# Patient Record
Sex: Female | Born: 1990 | Hispanic: No | Marital: Single | Smoking: Never smoker
Health system: Southern US, Community
[De-identification: ages and names within clinical notes are randomized; demographics above are authoritative.]

## PROBLEM LIST (undated history)

## (undated) ENCOUNTER — Emergency Department (HOSPITAL_COMMUNITY): Payer: Self-pay | Source: Home / Self Care

---
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
## 2011-03-10 ENCOUNTER — Other Ambulatory Visit (HOSPITAL_COMMUNITY)
Admission: RE | Admit: 2011-03-10 | Discharge: 2011-03-10 | Disposition: A | Discharge status: Home / Self Care | Payer: Federal, State, Local not specified - PPO | Source: Ambulatory Visit | Attending: Obstetrics and Gynecology | Admitting: Obstetrics and Gynecology

## 2011-03-10 DIAGNOSIS — Z01419 Encounter for gynecological examination (general) (routine) without abnormal findings: Secondary | ICD-10-CM | POA: Insufficient documentation

## 2011-03-10 DIAGNOSIS — N760 Acute vaginitis: Secondary | ICD-10-CM | POA: Insufficient documentation

## 2011-03-10 DIAGNOSIS — Z113 Encounter for screening for infections with a predominantly sexual mode of transmission: Secondary | ICD-10-CM | POA: Insufficient documentation

## 2012-03-10 ENCOUNTER — Other Ambulatory Visit (HOSPITAL_COMMUNITY)
Admission: RE | Admit: 2012-03-10 | Discharge: 2012-03-10 | Disposition: A | Discharge status: Home / Self Care | Payer: Federal, State, Local not specified - PPO | Source: Ambulatory Visit | Attending: Obstetrics and Gynecology | Admitting: Obstetrics and Gynecology

## 2012-03-10 DIAGNOSIS — Z113 Encounter for screening for infections with a predominantly sexual mode of transmission: Secondary | ICD-10-CM | POA: Insufficient documentation

## 2012-03-10 DIAGNOSIS — N760 Acute vaginitis: Secondary | ICD-10-CM | POA: Insufficient documentation

## 2012-03-10 DIAGNOSIS — Z01419 Encounter for gynecological examination (general) (routine) without abnormal findings: Secondary | ICD-10-CM | POA: Insufficient documentation

## 2014-07-06 ENCOUNTER — Other Ambulatory Visit (HOSPITAL_COMMUNITY)
Admission: RE | Admit: 2014-07-06 | Discharge: 2014-07-06 | Disposition: A | Discharge status: Home / Self Care | Payer: Federal, State, Local not specified - PPO | Source: Ambulatory Visit | Attending: Obstetrics and Gynecology | Admitting: Obstetrics and Gynecology

## 2014-07-06 ENCOUNTER — Other Ambulatory Visit: Payer: Self-pay | Admitting: Obstetrics and Gynecology

## 2014-07-06 DIAGNOSIS — Z01419 Encounter for gynecological examination (general) (routine) without abnormal findings: Secondary | ICD-10-CM | POA: Insufficient documentation

## 2014-07-07 LAB — CYTOLOGY - PAP

## 2015-02-09 ENCOUNTER — Emergency Department (HOSPITAL_COMMUNITY): Payer: Self-pay

## 2015-02-09 ENCOUNTER — Encounter (HOSPITAL_COMMUNITY): Payer: Self-pay | Admitting: Emergency Medicine

## 2015-02-09 ENCOUNTER — Inpatient Hospital Stay (HOSPITAL_COMMUNITY)
Admission: EM | Admit: 2015-02-09 | Discharge: 2015-02-14 | DRG: 517 | Disposition: A | Discharge status: Home Health | Payer: Self-pay | Attending: General Surgery | Admitting: General Surgery

## 2015-02-09 ENCOUNTER — Inpatient Hospital Stay (HOSPITAL_COMMUNITY): Payer: Self-pay

## 2015-02-09 DIAGNOSIS — S80211A Abrasion, right knee, initial encounter: Secondary | ICD-10-CM | POA: Diagnosis present

## 2015-02-09 DIAGNOSIS — S40811A Abrasion of right upper arm, initial encounter: Secondary | ICD-10-CM | POA: Diagnosis present

## 2015-02-09 DIAGNOSIS — S42002A Fracture of unspecified part of left clavicle, initial encounter for closed fracture: Secondary | ICD-10-CM | POA: Diagnosis present

## 2015-02-09 DIAGNOSIS — S62626A Displaced fracture of medial phalanx of right little finger, initial encounter for closed fracture: Secondary | ICD-10-CM

## 2015-02-09 DIAGNOSIS — S80212A Abrasion, left knee, initial encounter: Secondary | ICD-10-CM | POA: Diagnosis present

## 2015-02-09 DIAGNOSIS — S50312A Abrasion of left elbow, initial encounter: Secondary | ICD-10-CM | POA: Diagnosis present

## 2015-02-09 DIAGNOSIS — S30811A Abrasion of abdominal wall, initial encounter: Secondary | ICD-10-CM | POA: Diagnosis present

## 2015-02-09 DIAGNOSIS — S62606A Fracture of unspecified phalanx of right little finger, initial encounter for closed fracture: Secondary | ICD-10-CM | POA: Diagnosis present

## 2015-02-09 DIAGNOSIS — S62616A Displaced fracture of proximal phalanx of right little finger, initial encounter for closed fracture: Principal | ICD-10-CM | POA: Diagnosis present

## 2015-02-09 DIAGNOSIS — T07XXXA Unspecified multiple injuries, initial encounter: Secondary | ICD-10-CM

## 2015-02-09 DIAGNOSIS — T148XXA Other injury of unspecified body region, initial encounter: Secondary | ICD-10-CM

## 2015-02-09 DIAGNOSIS — Z23 Encounter for immunization: Secondary | ICD-10-CM

## 2015-02-09 DIAGNOSIS — S50812A Abrasion of left forearm, initial encounter: Secondary | ICD-10-CM | POA: Diagnosis present

## 2015-02-09 LAB — COMPREHENSIVE METABOLIC PANEL
ALBUMIN: 3.9 g/dL (ref 3.5–5.0)
ALK PHOS: 60 U/L (ref 38–126)
ALT: 15 U/L (ref 14–54)
ANION GAP: 10 (ref 5–15)
AST: 29 U/L (ref 15–41)
BILIRUBIN TOTAL: 0.4 mg/dL (ref 0.3–1.2)
BUN: 10 mg/dL (ref 6–20)
CALCIUM: 8.8 mg/dL — AB (ref 8.9–10.3)
CO2: 19 mmol/L — ABNORMAL LOW (ref 22–32)
Chloride: 111 mmol/L (ref 101–111)
Creatinine, Ser: 0.8 mg/dL (ref 0.44–1.00)
GFR calc non Af Amer: >60 mL/min (ref >60)
GLUCOSE: 154 mg/dL — AB (ref 65–99)
POTASSIUM: 3.9 mmol/L (ref 3.5–5.1)
Sodium: 140 mmol/L (ref 135–145)
TOTAL PROTEIN: 7 g/dL (ref 6.5–8.1)

## 2015-02-09 LAB — CBC WITH DIFFERENTIAL/PLATELET
BASOS PCT: 0 %
Basophils Absolute: 0 10*3/uL (ref 0.0–0.1)
EOS ABS: 0 10*3/uL (ref 0.0–0.7)
Eosinophils Relative: 0 %
HEMATOCRIT: 37.1 % (ref 36.0–46.0)
Hemoglobin: 12.5 g/dL (ref 12.0–15.0)
LYMPHS ABS: 0.9 10*3/uL (ref 0.7–4.0)
Lymphocytes Relative: 5 %
MCH: 30 pg (ref 26.0–34.0)
MCHC: 33.7 g/dL (ref 30.0–36.0)
MCV: 89.2 fL (ref 78.0–100.0)
MONO ABS: 0.8 10*3/uL (ref 0.1–1.0)
MONOS PCT: 5 %
NEUTROS ABS: 15.9 10*3/uL — AB (ref 1.7–7.7)
Neutrophils Relative %: 90 %
Platelets: 321 10*3/uL (ref 150–400)
RBC: 4.16 MIL/uL (ref 3.87–5.11)
RDW: 13.4 % (ref 11.5–15.5)
WBC: 17.6 10*3/uL — ABNORMAL HIGH (ref 4.0–10.5)

## 2015-02-09 LAB — URINALYSIS, ROUTINE W REFLEX MICROSCOPIC
BILIRUBIN URINE: NEGATIVE
Glucose, UA: NEGATIVE mg/dL
KETONES UR: NEGATIVE mg/dL
LEUKOCYTES UA: NEGATIVE
NITRITE: NEGATIVE
PH: 5 (ref 5.0–8.0)
PROTEIN: NEGATIVE mg/dL
Specific Gravity, Urine: 1.02 (ref 1.005–1.030)
UROBILINOGEN UA: 0.2 mg/dL (ref 0.0–1.0)

## 2015-02-09 LAB — CBC
HCT: 38.6 % (ref 36.0–46.0)
HEMOGLOBIN: 13.1 g/dL (ref 12.0–15.0)
MCH: 30 pg (ref 26.0–34.0)
MCHC: 33.9 g/dL (ref 30.0–36.0)
MCV: 88.5 fL (ref 78.0–100.0)
Platelets: 324 10*3/uL (ref 150–400)
RBC: 4.36 MIL/uL (ref 3.87–5.11)
RDW: 13.4 % (ref 11.5–15.5)
WBC: 16 10*3/uL — ABNORMAL HIGH (ref 4.0–10.5)

## 2015-02-09 LAB — I-STAT TROPONIN, ED: TROPONIN I, POC: 0 ng/mL (ref 0.00–0.08)

## 2015-02-09 LAB — URINE MICROSCOPIC-ADD ON

## 2015-02-09 LAB — I-STAT BETA HCG BLOOD, ED (MC, WL, AP ONLY)

## 2015-02-09 MED ORDER — OXYCODONE-ACETAMINOPHEN 5-325 MG PO TABS: 1.0000 | ORAL_TABLET | ORAL | Status: AC | PRN

## 2015-02-09 MED ORDER — PANTOPRAZOLE SODIUM 40 MG IV SOLR: 40.0000 mg | Freq: Every day | INTRAVENOUS | Status: DC

## 2015-02-09 MED ORDER — MORPHINE SULFATE (PF) 2 MG/ML IV SOLN
2.0000 mg | INTRAVENOUS | Status: DC | PRN
  Administered 2015-02-09 – 2015-02-11 (×3): 2 mg via INTRAVENOUS
  Filled 2015-02-09 (×5): qty 1

## 2015-02-09 MED ORDER — TETANUS-DIPHTH-ACELL PERTUSSIS 5-2.5-18.5 LF-MCG/0.5 IM SUSP: 0.5000 mL | Freq: Once | INTRAMUSCULAR | Status: DC

## 2015-02-09 MED ORDER — SENNOSIDES-DOCUSATE SODIUM 8.6-50 MG PO TABS: 1.0000 | ORAL_TABLET | Freq: Every day | ORAL | Status: AC

## 2015-02-09 MED ORDER — ONDANSETRON HCL 4 MG/2ML IJ SOLN
4.0000 mg | Freq: Four times a day (QID) | INTRAMUSCULAR | Status: DC | PRN
  Administered 2015-02-09 – 2015-02-13 (×2): 4 mg via INTRAVENOUS
  Filled 2015-02-09 (×2): qty 2

## 2015-02-09 MED ORDER — MORPHINE SULFATE (PF) 4 MG/ML IV SOLN: 4.0000 mg | Freq: Once | INTRAVENOUS | Status: AC

## 2015-02-09 MED ORDER — SILVER SULFADIAZINE 1 % EX CREA
TOPICAL_CREAM | Freq: Every day | CUTANEOUS | Status: DC
  Administered 2015-02-10 – 2015-02-11 (×2): via TOPICAL
  Administered 2015-02-12: 1 via TOPICAL
  Administered 2015-02-14: 14:00:00 via TOPICAL
  Filled 2015-02-09 (×4): qty 85

## 2015-02-09 MED ORDER — SILVER SULFADIAZINE 1 % EX CREA: TOPICAL_CREAM | Freq: Once | CUTANEOUS | Status: DC

## 2015-02-09 MED ORDER — TETANUS-DIPHTH-ACELL PERTUSSIS 5-2.5-18.5 LF-MCG/0.5 IM SUSP
0.5000 mL | Freq: Once | INTRAMUSCULAR | Status: AC
  Administered 2015-02-09: 0.5 mL via INTRAMUSCULAR
  Filled 2015-02-09: qty 0.5

## 2015-02-09 MED ORDER — OXYCODONE HCL 5 MG PO TABS
10.0000 mg | ORAL_TABLET | ORAL | Status: DC | PRN
  Administered 2015-02-09 – 2015-02-14 (×6): 10 mg via ORAL
  Filled 2015-02-09 (×8): qty 2

## 2015-02-09 MED ORDER — PANTOPRAZOLE SODIUM 40 MG PO TBEC
40.0000 mg | DELAYED_RELEASE_TABLET | Freq: Every day | ORAL | Status: DC
  Administered 2015-02-10 – 2015-02-11 (×2): 40 mg via ORAL
  Filled 2015-02-09 (×2): qty 1

## 2015-02-09 MED ORDER — MORPHINE SULFATE (PF) 4 MG/ML IV SOLN
4.0000 mg | Freq: Once | INTRAVENOUS | Status: DC
  Administered 2015-02-09: 4 mg via INTRAVENOUS
  Filled 2015-02-09: qty 1

## 2015-02-09 MED ORDER — MORPHINE SULFATE (PF) 2 MG/ML IV SOLN
2.0000 mg | Freq: Once | INTRAVENOUS | Status: AC
  Administered 2015-02-09: 2 mg via INTRAVENOUS
  Filled 2015-02-09: qty 1

## 2015-02-09 MED ORDER — MORPHINE SULFATE (PF) 4 MG/ML IV SOLN
4.0000 mg | Freq: Once | INTRAVENOUS | Status: AC
  Administered 2015-02-09: 4 mg via INTRAVENOUS
  Filled 2015-02-09: qty 1

## 2015-02-09 MED ORDER — CEPHALEXIN 250 MG PO CAPS: 250.0000 mg | ORAL_CAPSULE | Freq: Four times a day (QID) | ORAL | Status: DC

## 2015-02-09 MED ORDER — LIDOCAINE HCL (PF) 1 % IJ SOLN
30.0000 mL | Freq: Once | INTRAMUSCULAR | Status: AC
  Administered 2015-02-09: 30 mL via INTRADERMAL
  Filled 2015-02-09: qty 30

## 2015-02-09 MED ORDER — KCL IN DEXTROSE-NACL 20-5-0.45 MEQ/L-%-% IV SOLN
INTRAVENOUS | Status: DC
  Administered 2015-02-09: 16:00:00 via INTRAVENOUS
  Filled 2015-02-09: qty 1000

## 2015-02-09 MED ORDER — ACETAMINOPHEN 325 MG PO TABS
650.0000 mg | ORAL_TABLET | ORAL | Status: DC | PRN
  Administered 2015-02-09 – 2015-02-11 (×4): 650 mg via ORAL
  Filled 2015-02-09 (×4): qty 2

## 2015-02-09 MED ORDER — SODIUM CHLORIDE 0.9 % IV BOLUS (SEPSIS)
1000.0000 mL | Freq: Once | INTRAVENOUS | Status: AC
  Administered 2015-02-09: 1000 mL via INTRAVENOUS

## 2015-02-09 MED ORDER — ONDANSETRON HCL 4 MG PO TABS
4.0000 mg | ORAL_TABLET | Freq: Four times a day (QID) | ORAL | Status: DC | PRN
  Administered 2015-02-11 – 2015-02-12 (×2): 4 mg via ORAL
  Filled 2015-02-09 (×2): qty 1

## 2015-02-09 MED ORDER — IOHEXOL 300 MG/ML  SOLN
100.0000 mL | Freq: Once | INTRAMUSCULAR | Status: AC | PRN
  Administered 2015-02-09: 100 mL via INTRAVENOUS

## 2015-02-09 MED ORDER — OXYCODONE HCL 5 MG PO TABS
5.0000 mg | ORAL_TABLET | ORAL | Status: DC | PRN
  Administered 2015-02-11: 5 mg via ORAL
  Filled 2015-02-09 (×2): qty 1

## 2015-02-09 MED ORDER — BACITRACIN ZINC 500 UNIT/GM EX OINT
TOPICAL_OINTMENT | Freq: Two times a day (BID) | CUTANEOUS | Status: DC
  Administered 2015-02-09: 1 via TOPICAL
  Filled 2015-02-09 (×2): qty 1.8

## 2015-02-09 MED ORDER — BACITRACIN ZINC 500 UNIT/GM EX OINT: 1.0000 "application " | TOPICAL_OINTMENT | Freq: Two times a day (BID) | CUTANEOUS | Status: AC

## 2015-02-09 MED ORDER — OXYCODONE-ACETAMINOPHEN 5-325 MG PO TABS
2.0000 | ORAL_TABLET | Freq: Once | ORAL | Status: AC
  Administered 2015-02-09: 2 via ORAL
  Filled 2015-02-09: qty 2
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
## 2015-02-09 NOTE — ED Notes (Signed)
Pt returned from CT °

## 2015-02-09 NOTE — ED Notes (Signed)
Pt transported to xray 
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
## 2015-02-09 NOTE — Progress Notes (Signed)
Upright xrays show acceptable alignment of left clavicle.  Will continue with nonop treatment.  Mayra Reel, MD [facility] [phone] 8:13 PM
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
## 2015-02-09 NOTE — ED Notes (Signed)
Called Dr. Ranae Palms as patient requests left hand be xray'd as well, since it is sore. Abrasion present with mild swelling. md acknowledges.
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
## 2015-02-09 NOTE — ED Notes (Signed)
Abrasions noted in physical diagram.  Patient would not allow this nurse to physically assess her skin as I am a female and her religious beliefs prohibit this.

## 2015-02-09 NOTE — ED Notes (Signed)
Patient is out of the department for testing; visitor sitting in room waiting for patient to return

## 2015-02-09 NOTE — ED Notes (Signed)
Dr. Rosalia Hammers informed of elevated HR prior to patient being dc.  To see pt.  Additional scans and consult to trauma ordered.

## 2015-02-09 NOTE — ED Notes (Signed)
Patient returned from xray. MD and RN at bedside.

## 2015-02-09 NOTE — Consult Note (Signed)
  See [id number] Amanda Pea MD

## 2015-02-10 MED ORDER — SILVER SULFADIAZINE 1 % EX CREA
TOPICAL_CREAM | CUTANEOUS | Status: DC
  Administered 2015-02-11: 15:00:00 via TOPICAL
  Administered 2015-02-12: 1 via TOPICAL
  Filled 2015-02-10: qty 85

## 2015-02-10 MED ORDER — SILVER SULFADIAZINE 1 % EX CREA: TOPICAL_CREAM | Freq: Every day | CUTANEOUS | Status: DC
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
## 2015-02-10 NOTE — Progress Notes (Signed)
Patient refused labs this morning.  She said that both her arms are sore.  She is requesting an order for feet sticks for lab.

## 2015-02-11 MED ORDER — POLYETHYLENE GLYCOL 3350 17 G PO PACK
17.0000 g | PACK | Freq: Every day | ORAL | Status: DC
  Administered 2015-02-11 – 2015-02-14 (×4): 17 g via ORAL
  Filled 2015-02-11 (×4): qty 1

## 2015-02-11 MED ORDER — ENOXAPARIN SODIUM 40 MG/0.4ML ~~LOC~~ SOLN
40.0000 mg | SUBCUTANEOUS | Status: DC
  Administered 2015-02-11 – 2015-02-14 (×4): 40 mg via SUBCUTANEOUS
  Filled 2015-02-11 (×4): qty 0.4
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
## 2015-02-11 NOTE — Progress Notes (Signed)
PT Cancellation Note  Patient Details Name: Donnalee Cellucci MRN: [id number] DOB: [birth year]   Cancelled Treatment:    Reason Eval/Treat Not Completed: Patient at procedure or test/unavailable (Attempted x2 to see pt; RN changing dressing both times).  Will attempt to see pt again as time allows.  PT will continue to follow acutely.   Michail Jewels PT, DPT [phone] Pager: [phone]  02/11/2015, 3:19 PM

## 2015-02-11 NOTE — Progress Notes (Signed)
OT Cancellation Note  Patient Details Mackenzie [name] MRN: [id number] DOB: [birth year]   Cancelled Treatment:    Reason Eval/Treat Not Completed: Medical issues which prohibited therapy. Nurse administering lovenox. Will reattempt at a later time per protocol for ambulating post Lovenox.   Pilar Grammes 02/11/2015, 11:37 AM

## 2015-02-12 ENCOUNTER — Encounter: Payer: Self-pay | Admitting: Orthopedic Surgery

## 2015-02-12 DIAGNOSIS — T07XXXA Unspecified multiple injuries, initial encounter: Secondary | ICD-10-CM

## 2015-02-12 DIAGNOSIS — S62606A Fracture of unspecified phalanx of right little finger, initial encounter for closed fracture: Secondary | ICD-10-CM | POA: Diagnosis present

## 2015-02-12 MED ORDER — DOCUSATE SODIUM 100 MG PO CAPS
100.0000 mg | ORAL_CAPSULE | Freq: Two times a day (BID) | ORAL | Status: DC
  Administered 2015-02-12 – 2015-02-14 (×4): 100 mg via ORAL
  Filled 2015-02-12 (×4): qty 1
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
## 2015-02-12 NOTE — Progress Notes (Signed)
Patient ID: Mackenzie [name], female   DOB: 1990-08-01, 24 y.o.   MRN: [id number] Patient seen and examined  Finger is still tender. It is in her bandage which I previously placed.  I discussed with patient our plans for surgery. We'll discuss with trauma. We'll plan for ORIF versus closed reduction and pinning right small finger tomorrow.  All questions have been encouraged and answered  We are planning surgery for your upper extremity. The risk and benefits of surgery to include risk of bleeding, infection, anesthesia,  damage to normal structures and failure of the surgery to accomplish its intended goals of relieving symptoms and restoring function have been discussed in detail. With this in mind we plan to proceed. I have specifically discussed with the patient the pre-and postoperative regime and the dos and don'ts and risk and benefits in great detail. Risk and benefits of surgery also include risk of dystrophy(CRPS), chronic nerve pain, failure of the healing process to go onto completion and other inherent risks of surgery The relavent the pathophysiology of the disease/injury process, as well as the alternatives for treatment and postoperative course of action has been discussed in great detail with the patient who desires to proceed.  We will do everything in our power to help you (the patient) restore function to the upper extremity. It is a pleasure to see this patient today.  Gramig MD

## 2015-02-12 NOTE — Progress Notes (Signed)
Patient ID: Mackenzie [name], female   DOB: 03-Sep-1990, 24 y.o.   MRN: [id number] Events noted Right small finger will need elective ORIF Will plan this week Patient aware [name] MD
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
## 2015-02-13 ENCOUNTER — Inpatient Hospital Stay (HOSPITAL_COMMUNITY): Payer: Federal, State, Local not specified - PPO | Admitting: Anesthesiology

## 2015-02-13 ENCOUNTER — Encounter (HOSPITAL_COMMUNITY): Payer: Self-pay

## 2015-02-13 ENCOUNTER — Encounter (HOSPITAL_COMMUNITY): Admission: EM | Disposition: A | Discharge status: Home Health | Payer: Self-pay | Source: Home / Self Care

## 2015-02-13 ENCOUNTER — Inpatient Hospital Stay (HOSPITAL_COMMUNITY): Payer: Self-pay | Admitting: Anesthesiology

## 2015-02-13 HISTORY — PX: OPEN REDUCTION INTERNAL FIXATION (ORIF) METACARPAL: SHX6234

## 2015-02-13 LAB — SURGICAL PCR SCREEN
MRSA, PCR: NEGATIVE
Staphylococcus aureus: NEGATIVE

## 2015-02-13 SURGERY — OPEN REDUCTION INTERNAL FIXATION (ORIF) METACARPAL
Anesthesia: General | Site: Hand | Laterality: Right

## 2015-02-13 MED ORDER — ONDANSETRON HCL 4 MG/2ML IJ SOLN
INTRAMUSCULAR | Status: DC | PRN
  Administered 2015-02-13: 4 mg via INTRAVENOUS

## 2015-02-13 MED ORDER — BUPIVACAINE HCL (PF) 0.25 % IJ SOLN
INTRAMUSCULAR | Status: DC | PRN
  Administered 2015-02-13: 9 mL

## 2015-02-13 MED ORDER — MIDAZOLAM HCL 2 MG/2ML IJ SOLN
INTRAMUSCULAR | Status: AC
  Filled 2015-02-13: qty 4

## 2015-02-13 MED ORDER — LACTATED RINGERS IV SOLN
INTRAVENOUS | Status: DC | PRN
  Administered 2015-02-13: 18:00:00 via INTRAVENOUS

## 2015-02-13 MED ORDER — MEPERIDINE HCL 25 MG/ML IJ SOLN: 6.2500 mg | INTRAMUSCULAR | Status: DC | PRN

## 2015-02-13 MED ORDER — FENTANYL CITRATE (PF) 100 MCG/2ML IJ SOLN
INTRAMUSCULAR | Status: DC | PRN
  Administered 2015-02-13 (×2): 50 ug via INTRAVENOUS
  Administered 2015-02-13: 100 ug via INTRAVENOUS
  Administered 2015-02-13: 50 ug via INTRAVENOUS

## 2015-02-13 MED ORDER — PROPOFOL 10 MG/ML IV BOLUS
INTRAVENOUS | Status: DC | PRN
  Administered 2015-02-13: 150 mg via INTRAVENOUS

## 2015-02-13 MED ORDER — PHENYLEPHRINE HCL 10 MG/ML IJ SOLN
INTRAMUSCULAR | Status: DC | PRN
  Administered 2015-02-13 (×3): 80 ug via INTRAVENOUS

## 2015-02-13 MED ORDER — LIDOCAINE HCL (CARDIAC) 20 MG/ML IV SOLN
INTRAVENOUS | Status: AC
  Filled 2015-02-13: qty 5

## 2015-02-13 MED ORDER — BUPIVACAINE HCL (PF) 0.5 % IJ SOLN
INTRAMUSCULAR | Status: AC
  Filled 2015-02-13: qty 10

## 2015-02-13 MED ORDER — HYDROMORPHONE HCL 1 MG/ML IJ SOLN
INTRAMUSCULAR | Status: AC
  Filled 2015-02-13: qty 1

## 2015-02-13 MED ORDER — MIDAZOLAM HCL 5 MG/5ML IJ SOLN
INTRAMUSCULAR | Status: DC | PRN
  Administered 2015-02-13 (×2): 2 mg via INTRAVENOUS

## 2015-02-13 MED ORDER — BUPIVACAINE-EPINEPHRINE (PF) 0.25% -1:200000 IJ SOLN
INTRAMUSCULAR | Status: AC
  Filled 2015-02-13: qty 30

## 2015-02-13 MED ORDER — PROMETHAZINE HCL 25 MG/ML IJ SOLN: 6.2500 mg | INTRAMUSCULAR | Status: DC | PRN

## 2015-02-13 MED ORDER — LACTATED RINGERS IV SOLN
INTRAVENOUS | Status: DC
  Administered 2015-02-13: 16:00:00 via INTRAVENOUS

## 2015-02-13 MED ORDER — ONDANSETRON HCL 4 MG/2ML IJ SOLN
4.0000 mg | Freq: Once | INTRAMUSCULAR | Status: DC | PRN
  Filled 2015-02-13: qty 2

## 2015-02-13 MED ORDER — LIDOCAINE HCL (CARDIAC) 20 MG/ML IV SOLN
INTRAVENOUS | Status: DC | PRN
  Administered 2015-02-13: 50 mg via INTRAVENOUS

## 2015-02-13 MED ORDER — FENTANYL CITRATE (PF) 250 MCG/5ML IJ SOLN
INTRAMUSCULAR | Status: AC
  Filled 2015-02-13: qty 5

## 2015-02-13 MED ORDER — FENTANYL CITRATE (PF) 100 MCG/2ML IJ SOLN
INTRAMUSCULAR | Status: AC
  Filled 2015-02-13: qty 2

## 2015-02-13 MED ORDER — FENTANYL CITRATE (PF) 100 MCG/2ML IJ SOLN
25.0000 ug | INTRAMUSCULAR | Status: DC | PRN
  Administered 2015-02-13 (×2): 25 ug via INTRAVENOUS

## 2015-02-13 MED ORDER — OXYCODONE HCL 5 MG/5ML PO SOLN: 5.0000 mg | Freq: Once | ORAL | Status: DC | PRN

## 2015-02-13 MED ORDER — 0.9 % SODIUM CHLORIDE (POUR BTL) OPTIME
TOPICAL | Status: DC | PRN
  Administered 2015-02-13: 1000 mL

## 2015-02-13 MED ORDER — CEFAZOLIN SODIUM-DEXTROSE 2-3 GM-% IV SOLR
INTRAVENOUS | Status: DC | PRN
  Administered 2015-02-13: 2 g via INTRAVENOUS

## 2015-02-13 MED ORDER — BUPIVACAINE HCL (PF) 0.25 % IJ SOLN
INTRAMUSCULAR | Status: AC
  Filled 2015-02-13: qty 30

## 2015-02-13 MED ORDER — PROPOFOL 10 MG/ML IV BOLUS
INTRAVENOUS | Status: AC
  Filled 2015-02-13: qty 20

## 2015-02-13 MED ORDER — OXYCODONE HCL 5 MG PO TABS: 5.0000 mg | ORAL_TABLET | Freq: Once | ORAL | Status: DC | PRN

## 2015-02-13 SURGICAL SUPPLY — 60 items
BANDAGE ELASTIC 3 VELCRO ST LF (GAUZE/BANDAGES/DRESSINGS) IMPLANT
BANDAGE ELASTIC 4 VELCRO ST LF (GAUZE/BANDAGES/DRESSINGS) IMPLANT
BLADE SURG ROTATE 9660 (MISCELLANEOUS) IMPLANT
BNDG ESMARK 4X9 LF (GAUZE/BANDAGES/DRESSINGS) IMPLANT
BNDG GAUZE ELAST 4 BULKY (GAUZE/BANDAGES/DRESSINGS) IMPLANT
CORDS BIPOLAR (ELECTRODE) ×3 IMPLANT
COVER SURGICAL LIGHT HANDLE (MISCELLANEOUS) ×3 IMPLANT
CUFF TOURNIQUET SINGLE 18IN (TOURNIQUET CUFF) ×3 IMPLANT
CUFF TOURNIQUET SINGLE 24IN (TOURNIQUET CUFF) IMPLANT
DECANTER SPIKE VIAL GLASS SM (MISCELLANEOUS) ×3 IMPLANT
DRAIN TLS ROUND 10FR (DRAIN) IMPLANT
DRAPE OEC MINIVIEW 54X84 (DRAPES) ×3 IMPLANT
DRAPE SURG 17X23 STRL (DRAPES) ×3 IMPLANT
DRSG ADAPTIC 3X8 NADH LF (GAUZE/BANDAGES/DRESSINGS) ×3 IMPLANT
GAUZE SPONGE 4X4 12PLY STRL (GAUZE/BANDAGES/DRESSINGS) ×3 IMPLANT
GAUZE XEROFORM 1X8 LF (GAUZE/BANDAGES/DRESSINGS) IMPLANT
GAUZE XEROFORM 5X9 LF (GAUZE/BANDAGES/DRESSINGS) ×3 IMPLANT
GLOVE BIO SURGEON STRL SZ7 (GLOVE) ×3 IMPLANT
GLOVE BIOGEL M STRL SZ7.5 (GLOVE) IMPLANT
GLOVE BIOGEL PI IND STRL 6.5 (GLOVE) ×1 IMPLANT
GLOVE BIOGEL PI IND STRL 7.5 (GLOVE) ×1 IMPLANT
GLOVE BIOGEL PI INDICATOR 6.5 (GLOVE) ×2
GLOVE BIOGEL PI INDICATOR 7.5 (GLOVE) ×2
GLOVE SS BIOGEL STRL SZ 8 (GLOVE) ×1 IMPLANT
GLOVE SUPERSENSE BIOGEL SZ 8 (GLOVE) ×2
GLOVE SURG SS PI 7.0 STRL IVOR (GLOVE) ×3 IMPLANT
GOWN STRL REUS W/ TWL LRG LVL3 (GOWN DISPOSABLE) ×2 IMPLANT
GOWN STRL REUS W/ TWL XL LVL3 (GOWN DISPOSABLE) ×1 IMPLANT
GOWN STRL REUS W/TWL LRG LVL3 (GOWN DISPOSABLE) ×4
GOWN STRL REUS W/TWL XL LVL3 (GOWN DISPOSABLE) ×2
K-WIRE DBL 4X.28 (WIRE) ×6
K-WIRE DBL TROCAR .045X4 ×6 IMPLANT
KIT BASIN OR (CUSTOM PROCEDURE TRAY) ×3 IMPLANT
KIT ROOM TURNOVER OR (KITS) ×3 IMPLANT
KWIRE DBL 4X.28 (WIRE) ×2 IMPLANT
KWIRE DBL TROCAR .045X4 ×2 IMPLANT
LOOP VESSEL MAXI BLUE (MISCELLANEOUS) IMPLANT
MANIFOLD NEPTUNE II (INSTRUMENTS) IMPLANT
NEEDLE 22X1 1/2 (OR ONLY) (NEEDLE) ×3 IMPLANT
NS IRRIG 1000ML POUR BTL (IV SOLUTION) IMPLANT
PACK ORTHO EXTREMITY (CUSTOM PROCEDURE TRAY) ×3 IMPLANT
PAD ARMBOARD 7.5X6 YLW CONV (MISCELLANEOUS) ×3 IMPLANT
PAD CAST 3X4 CTTN HI CHSV (CAST SUPPLIES) IMPLANT
PAD CAST 4YDX4 CTTN HI CHSV (CAST SUPPLIES) IMPLANT
PADDING CAST COTTON 3X4 STRL (CAST SUPPLIES)
PADDING CAST COTTON 4X4 STRL (CAST SUPPLIES)
SPLINT FIBERGLASS 3X12 (CAST SUPPLIES) ×3 IMPLANT
SPONGE GAUZE 4X4 12PLY STER LF (GAUZE/BANDAGES/DRESSINGS) ×3 IMPLANT
SPONGE LAP 4X18 X RAY DECT (DISPOSABLE) IMPLANT
SUT MNCRL AB 4-0 PS2 18 (SUTURE) IMPLANT
SUT PROLENE 3 0 PS 2 (SUTURE) IMPLANT
SUT VIC AB 3-0 FS2 27 (SUTURE) IMPLANT
SYR CONTROL 10ML LL (SYRINGE) ×3 IMPLANT
SYSTEM CHEST DRAIN TLS 7FR (DRAIN) IMPLANT
TOWEL OR 17X24 6PK STRL BLUE (TOWEL DISPOSABLE) ×3 IMPLANT
TOWEL OR 17X26 10 PK STRL BLUE (TOWEL DISPOSABLE) ×3 IMPLANT
TUBE CONNECTING 12'X1/4 (SUCTIONS)
TUBE CONNECTING 12X1/4 (SUCTIONS) IMPLANT
TUBE EVACUATION TLS (MISCELLANEOUS) IMPLANT
WATER STERILE IRR 1000ML POUR (IV SOLUTION) IMPLANT

## 2015-02-13 NOTE — Progress Notes (Signed)
Patient is status post external dynamic fixator. She tolerated the procedure well. Tentatively the patient is planning for discharge tomorrow per trauma services. Patient is stable per hand surgery and will need to follow up with [place] at our office in one week. She should keep her splint on at all times, keep the dressings clean, dry, intact and do not remove this plan. We recommend she elevate the hand frequently above her heart. No weightbearing with the right hand. She can weight-bear through her proximal forearm and elbow. Range of motion about the elbow and shoulder is without restriction. We recommend Keflex 500 mg 1 by mouth 4 times a day 10 days at time of discharge. Please have the patient contact our office for any questions or concerns in regards to her upper extremity procedure at [phone].  Arlys John L. Areatha Keas [facility] Hand [facility]
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
## 2015-02-13 NOTE — Op Note (Signed)
See dictation#[id number] SP EX Fix right SF Fx Gramig MD

## 2015-02-13 NOTE — Anesthesia Preprocedure Evaluation (Addendum)
Anesthesia Evaluation  Patient identified by MRN, date of birth, ID band Patient awake    Reviewed: Allergy & Precautions, NPO status , Patient's Chart, lab work & pertinent test results  Airway Mallampati: II  TM Distance: >3 FB Neck ROM: Full    Dental no notable dental hx. (+) Teeth Intact, Dental Advisory Given   Pulmonary neg pulmonary ROS,    Pulmonary exam normal breath sounds clear to auscultation       Cardiovascular negative cardio ROS Normal cardiovascular exam Rhythm:Regular Rate:Normal     Neuro/Psych negative neurological ROS  negative psych ROS   GI/Hepatic negative GI ROS, Neg liver ROS,   Endo/Other  negative endocrine ROS  Renal/GU negative Renal ROS     Musculoskeletal negative musculoskeletal ROS (+)   Abdominal   Peds  Hematology negative hematology ROS (+)   Anesthesia Other Findings   Reproductive/Obstetrics negative OB ROS                            Anesthesia Physical Anesthesia Plan  ASA: II  Anesthesia Plan: General   Post-op Pain Management:    Induction: Intravenous  Airway Management Planned: LMA and Oral ETT  Additional Equipment:   Intra-op Plan:   Post-operative Plan: Extubation in OR  Informed Consent: I have reviewed the patients History and Physical, chart, labs and discussed the procedure including the risks, benefits and alternatives for the proposed anesthesia with the patient or authorized representative who has indicated his/her understanding and acceptance.   Dental advisory given  Plan Discussed with: CRNA  Anesthesia Plan Comments: (Plan GA with LMA  Kipp Brood)       Anesthesia Quick Evaluation
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
## 2015-02-13 NOTE — Anesthesia Procedure Notes (Signed)
Procedure Name: LMA Insertion Date/Time: 02/13/2015 6:26 PM Performed by: Gavin Pound, HAL J Pre-anesthesia Checklist: Patient identified, Emergency Drugs available, Suction available, Patient being monitored and Timeout performed Patient Re-evaluated:Patient Re-evaluated prior to inductionOxygen Delivery Method: Circle system utilized Preoxygenation: Pre-oxygenation with 100% oxygen Intubation Type: IV induction Ventilation: Mask ventilation without difficulty LMA: LMA inserted LMA Size: 4.0 Number of attempts: 1 Placement Confirmation: positive ETCO2 and breath sounds checked- equal and bilateral Tube secured with: Tape Dental Injury: Teeth and Oropharynx as per pre-operative assessment

## 2015-02-13 NOTE — Transfer of Care (Signed)
Immediate Anesthesia Transfer of Care Note  Patient: Mackenzie [name]  Procedure(s) Performed: Procedure(s): CLOSED REDUCTION and pinning fixation RIGHT SMALL FINGER (Right)  Patient Location: PACU  Anesthesia Type:General  Level of Consciousness: awake  Airway & Oxygen Therapy: Patient Spontanous Breathing  Post-op Assessment: Report given to RN and Post -op Vital signs reviewed and stable  Post vital signs: Reviewed and stable  Last Vitals:  Filed Vitals:   02/13/15 2022  BP: 109/38  Pulse: 91  Temp: 36.9 C  Resp: 13    Complications: No apparent anesthesia complications

## 2015-02-13 NOTE — Progress Notes (Signed)
Spoke with MD regarding pts WB status and activity orders of L UE. Pt is NBW in her LUE; no ROM of L shoulder; ROM of L fingers, wrist, hand OK; sling on at all times except for bathing, dressing and exercises.   Hurshel Party, M.S., OTR/L Pager: [phone]  02/13/15, 3:49 PM

## 2015-02-13 NOTE — H&P (Signed)
  Plan ORIF right small finger We are planning surgery for your upper extremity. The risk and benefits of surgery to include risk of bleeding, infection, anesthesia,  damage to normal structures and failure of the surgery to accomplish its intended goals of relieving symptoms and restoring function have been discussed in detail. With this in mind we plan to proceed. I have specifically discussed with the patient the pre-and postoperative regime and the dos and don'ts and risk and benefits in great detail. Risk and benefits of surgery also include risk of dystrophy(CRPS), chronic nerve pain, failure of the healing process to go onto completion and other inherent risks of surgery The relavent the pathophysiology of the disease/injury process, as well as the alternatives for treatment and postoperative course of action has been discussed in great detail with the patient who desires to proceed.  We will do everything in our power to help you (the patient) restore function to the upper extremity. It is a pleasure to see this patient today. Chrisopher Pustejovsky MD
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
## 2015-02-14 ENCOUNTER — Encounter (HOSPITAL_COMMUNITY): Payer: Self-pay | Admitting: Orthopedic Surgery

## 2015-02-14 MED ORDER — OXYCODONE HCL 10 MG PO TABS: 5.0000 mg | ORAL_TABLET | ORAL | Status: AC | PRN

## 2015-02-14 MED ORDER — CEPHALEXIN 500 MG PO CAPS: 500.0000 mg | ORAL_CAPSULE | Freq: Four times a day (QID) | ORAL | Status: AC

## 2015-02-14 MED ORDER — ACETAMINOPHEN 325 MG PO TABS: 650.0000 mg | ORAL_TABLET | ORAL | Status: AC | PRN

## 2015-02-14 MED ORDER — ONDANSETRON HCL 4 MG PO TABS: 4.0000 mg | ORAL_TABLET | Freq: Four times a day (QID) | ORAL | Status: AC | PRN

## 2015-02-14 MED ORDER — CEPHALEXIN 250 MG PO CAPS: 250.0000 mg | ORAL_CAPSULE | Freq: Four times a day (QID) | ORAL | Status: DC

## 2015-02-14 MED ORDER — SILVER SULFADIAZINE 1 % EX CREA: TOPICAL_CREAM | Freq: Every day | CUTANEOUS | Status: AC
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
## 2015-02-14 NOTE — Progress Notes (Signed)
Patient ID: Mackenzie [name], female   DOB: 04-25-1991, 24 y.o.   MRN: [id number] Patient seen at bedside Operation discussed with her at length  I would like see her back in the office in 1 week We would recommend Keflex 500 mg 4 times a day 10 days. All questions have been encouraged and answered. Gramig M.D.
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
## 2015-02-14 NOTE — Discharge Instructions (Signed)
Abrasion An abrasion is a cut or scrape of the skin. Abrasions do not extend through all layers of the skin and most heal within 10 days. It is important to care for your abrasion properly to prevent infection. CAUSES  Most abrasions are caused by falling on, or gliding across, the ground or other surface. When your skin rubs on something, the outer and inner layer of skin rubs off, causing an abrasion. DIAGNOSIS  Your caregiver will be able to diagnose an abrasion during a physical exam.  TREATMENT  Your treatment depends on how large and deep the abrasion is. Generally, your abrasion will be cleaned with water and a mild soap to remove any dirt or debris. An antibiotic ointment may be put over the abrasion to prevent an infection. A bandage (dressing) may be wrapped around the abrasion to keep it from getting dirty.  You may need a tetanus shot if:  You cannot remember when you had your last tetanus shot.  You have never had a tetanus shot.  The injury broke your skin. If you get a tetanus shot, your arm may swell, get red, and feel warm to the touch. This is common and not a problem. If you need a tetanus shot and you choose not to have one, there is a rare chance of getting tetanus. Sickness from tetanus can be serious.  HOME CARE INSTRUCTIONS   If a dressing was applied, change it at least once a day or as directed by your caregiver. If the bandage sticks, soak it off with warm water.   Wash the area with water and a mild soap to remove all the ointment 2 times a day. Rinse off the soap and pat the area dry with a clean towel.   Reapply any ointment as directed by your caregiver. This will help prevent infection and keep the bandage from sticking. Use gauze over the wound and under the dressing to help keep the bandage from sticking.   Change your dressing right away if it becomes wet or dirty.   Only take over-the-counter or prescription medicines for pain, discomfort, or fever as  directed by your caregiver.   Follow up with your caregiver within 24-48 hours for a wound check, or as directed. If you were not given a wound-check appointment, look closely at your abrasion for redness, swelling, or pus. These are signs of infection. SEEK IMMEDIATE MEDICAL CARE IF:   You have increasing pain in the wound.   You have redness, swelling, or tenderness around the wound.   You have pus coming from the wound.   You have a fever or persistent symptoms for more than 2-3 days.  You have a fever and your symptoms suddenly get worse.  You have a bad smell coming from the wound or dressing.  MAKE SURE YOU:   Understand these instructions.  Will watch your condition.  Will get help right away if you are not doing well or get worse. Document Released: 02/19/2005 Document Revised: 04/28/2012 Document Reviewed: 04/15/2011 [facility] Patient Information 2015 [place], [place]. This information is not intended to replace advice given to you by your health care provider. Make sure you discuss any questions you have with your health care provider.   Clavicle Fracture The clavicle, also called the collarbone, is the long bone that connects your shoulder to your rib cage. You can feel your collarbone at the top of your shoulders and rib cage. A clavicle fracture is a broken clavicle. It is a  common injury that can happen at any age.  CAUSES Common causes of a clavicle fracture include:  A direct blow to your shoulder.  A car accident.  A fall, especially if you try to break your fall with an outstretched arm. RISK FACTORS You may be at increased risk if:  You are younger than 25 years or older than 75 years. Most clavicle fractures happen to people who are younger than 25 years.  You are a female.  You play contact sports. SIGNS AND SYMPTOMS A fractured clavicle is painful. It also makes it hard to move your arm. Other signs and symptoms may include:  A shoulder that  drops downward and forward.  Pain when trying to lift your shoulder.  Bruising, swelling, and tenderness over your clavicle.  A grinding noise when you try to move your shoulder.  A bump over your clavicle. DIAGNOSIS Your health care provider can usually diagnose a clavicle fracture by asking about your injury and examining your shoulder and clavicle. He or she may take an X-ray to determine the position of your clavicle. TREATMENT Treatment depends on the position of your clavicle after the fracture:  If the broken ends of the bone are not out of place, your health care provider may put your arm in a sling or wrap a support bandage around your chest (figure-of-eight wrap).  If the broken ends of the bone are out of place, you may need surgery. Surgery may involve placing screws, pins, or plates to keep your clavicle stable while it heals. Healing may take about 3 months. When your health care provider thinks your fracture has healed enough, you may have to do physical therapy to regain normal movement and build up your arm strength. HOME CARE INSTRUCTIONS   Apply ice to the injured area:  Put ice in a plastic bag.  Place a towel between your skin and the bag.  Leave the ice on for 20 minutes, 2-3 times a day.  If you have a wrap or splint:  Wear it all the time, and remove it only to take a bath or shower.  When you bathe or shower, keep your shoulder in the same position as when the sling or wrap is on.  Do not lift your arm.  If you have a figure-of-eight wrap:  Another person must tighten it every day.  It should be tight enough to hold your shoulders back.  Allow enough room to place your index finger between your body and the strap.  Loosen the wrap immediately if you feel numbness or tingling in your hands.  Only take medicines as directed by your health care provider.  Avoid activities that make the injury or pain worse for 4-6 weeks after surgery.  Keep all  follow-up appointments. SEEK MEDICAL CARE IF:  Your medicine is not helping to relieve pain and swelling. SEEK IMMEDIATE MEDICAL CARE IF:  Your arm is numb, cold, or pale, even when the splint is loose. MAKE SURE YOU:   Understand these instructions.  Will watch your condition.  Will get help right away if you are not doing well or get worse. Document Released: 02/19/2005 Document Revised: 05/17/2013 Document Reviewed: 04/04/2013 [facility] Patient Information 2015 [place], [place]. This information is not intended to replace advice given to you by your health care provider. Make sure you discuss any questions you have with your health care provider.  Finger Fracture Fractures of fingers are breaks in the bones of the fingers. There are many  types of fractures. There are different ways of treating these fractures. Your health care provider will discuss the best way to treat your fracture. CAUSES Traumatic injury is the main cause of broken fingers. These include:  Injuries while playing sports.  Workplace injuries.  Falls. RISK FACTORS Activities that can increase your risk of finger fractures include:  Sports.  Workplace activities that involve machinery.  A condition called osteoporosis, which can make your bones less dense and cause them to fracture more easily. SIGNS AND SYMPTOMS The main symptoms of a broken finger are pain and swelling within 15 minutes after the injury. Other symptoms include:  Bruising of your finger.  Stiffness of your finger.  Numbness of your finger.  Exposed bones (compound fracture) if the fracture is severe. DIAGNOSIS  The best way to diagnose a broken bone is with X-ray imaging. Additionally, your health care provider will use this X-ray image to evaluate the position of the broken finger bones.  TREATMENT  Finger fractures can be treated with:   Nonreduction--This means the bones are in place. The finger is splinted without changing the  positions of the bone pieces. The splint is usually left on for about a week to 10 days. This will depend on your fracture and what your health care provider thinks.  Closed reduction--The bones are put back into position without using surgery. The finger is then splinted.  Open reduction and internal fixation--The fracture site is opened. Then the bone pieces are fixed into place with pins or some type of hardware. This is seldom required. It depends on the severity of the fracture. HOME CARE INSTRUCTIONS   Follow your health care provider's instructions regarding activities, exercises, and physical therapy.  Only take over-the-counter or prescription medicines for pain, discomfort, or fever as directed by your health care provider. SEEK MEDICAL CARE IF: You have pain or swelling that limits the motion or use of your fingers. SEEK IMMEDIATE MEDICAL CARE IF:  Your finger becomes numb. MAKE SURE YOU:   Understand these instructions.  Will watch your condition.  Will get help right away if you are not doing well or get worse. Document Released: 08/24/2000 Document Revised: 03/02/2013 Document Reviewed: 12/22/2012 [facility] Patient Information 2015 [place], [place]. This information is not intended to replace advice given to you by your health care provider. Make sure you discuss any questions you have with your health care provider.   WOUND CARE -cleanse wound with mild soap and water daily -apply silvadene cream and cover with an adherent dressing every day. -call if you have worsening redness or pus coming out from wound.  It is normal to have a yellow type drainage from the wound.   -call the trauma service if you develop fevers of 101 or above

## 2015-02-16 NOTE — Anesthesia Postprocedure Evaluation (Signed)
Anesthesia Post Note  Patient: Biomedical engineer  Procedure(s) Performed: Procedure(s) (LRB): CLOSED REDUCTION and pinning fixation RIGHT SMALL FINGER (Right)  Anesthesia type: General  Patient location: PACU  Post pain: Pain level controlled and Adequate analgesia  Post assessment: Post-op Vital signs reviewed, Patient's Cardiovascular Status Stable, Respiratory Function Stable, Patent Airway and Pain level controlled  Last Vitals:  Filed Vitals:   02/14/15 1300  BP: 136/73  Pulse: 79  Temp: 36.5 C  Resp: 18    Post vital signs: Reviewed and stable  Level of consciousness: awake, alert  and oriented  Complications: No apparent anesthesia complications

## 2015-02-28 ENCOUNTER — Ambulatory Visit: Payer: Self-pay

## 2015-03-02 ENCOUNTER — Ambulatory Visit: Payer: Self-pay

## 2015-03-05 ENCOUNTER — Ambulatory Visit: Payer: Self-pay | Attending: Family Medicine

## 2016-03-17 IMAGING — CR DG HAND 2V*R*
2 series · 2 of 2 positions shown · non-contrast
Comparison: Pre reduction exam this day.

CLINICAL DATA: Postreduction right little finger.

EXAM:
RIGHT HAND - 2 VIEW

[hand pa]
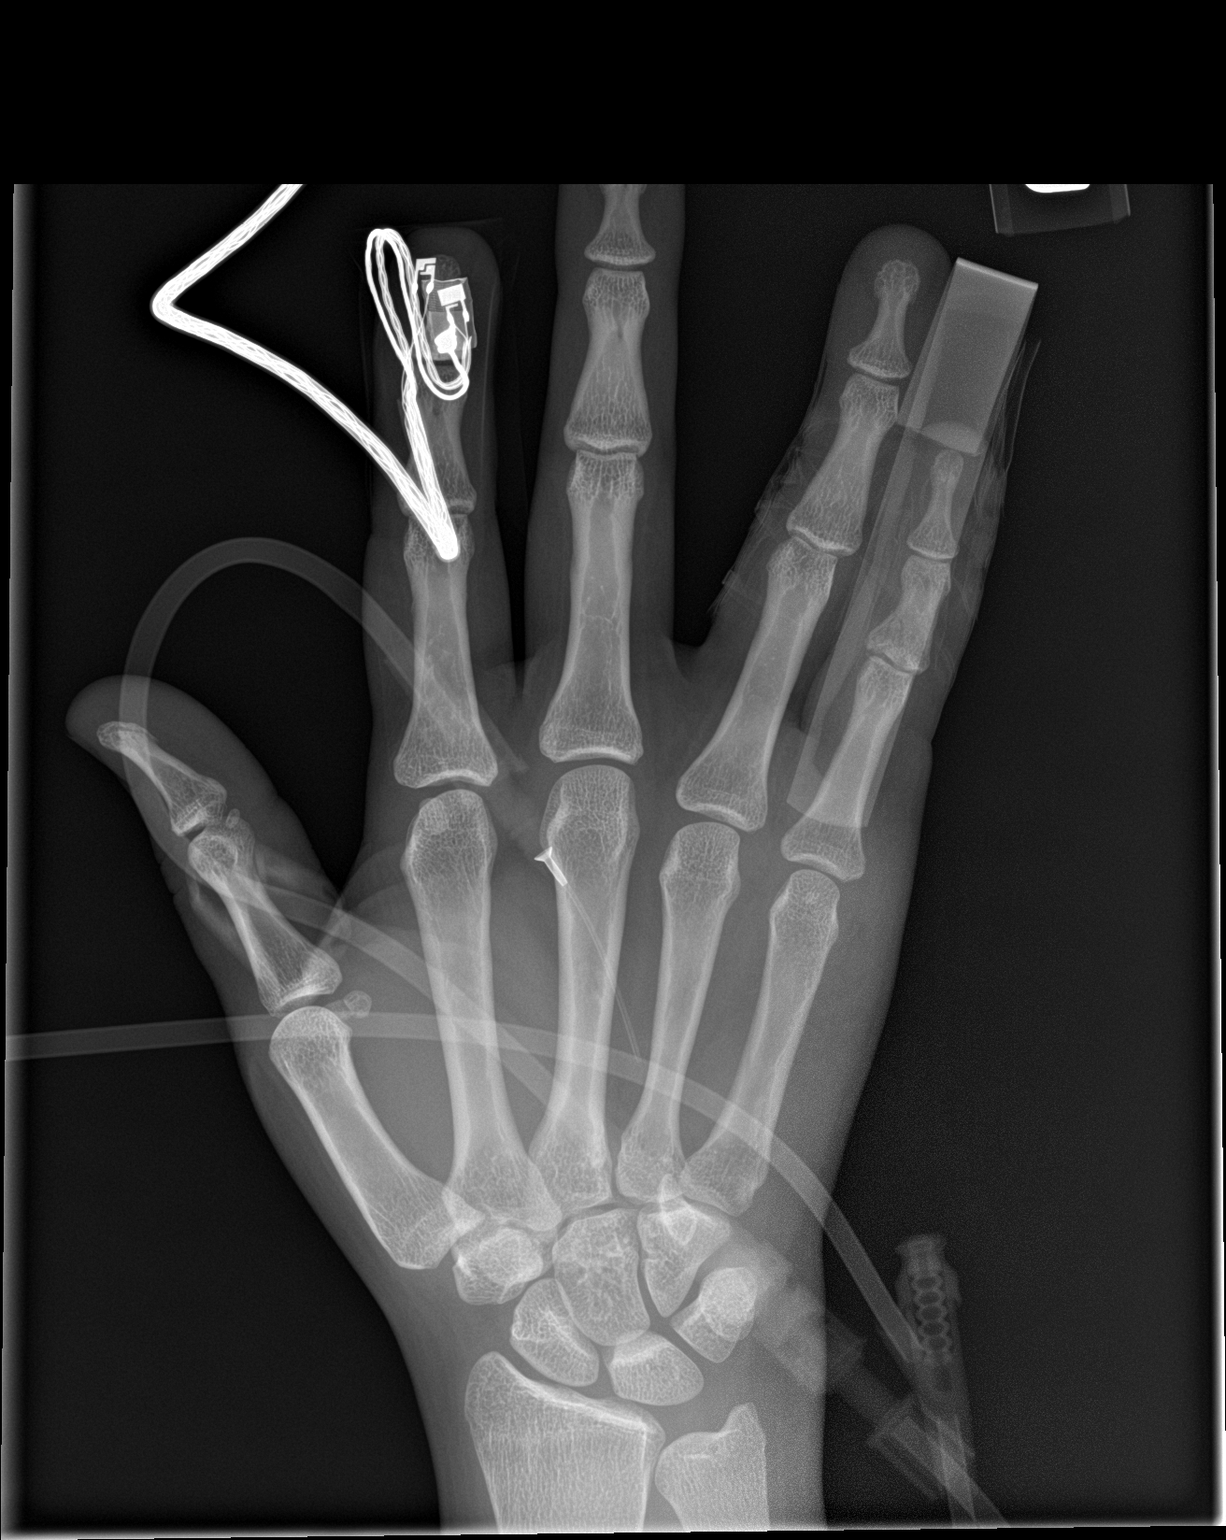

[hand lat]
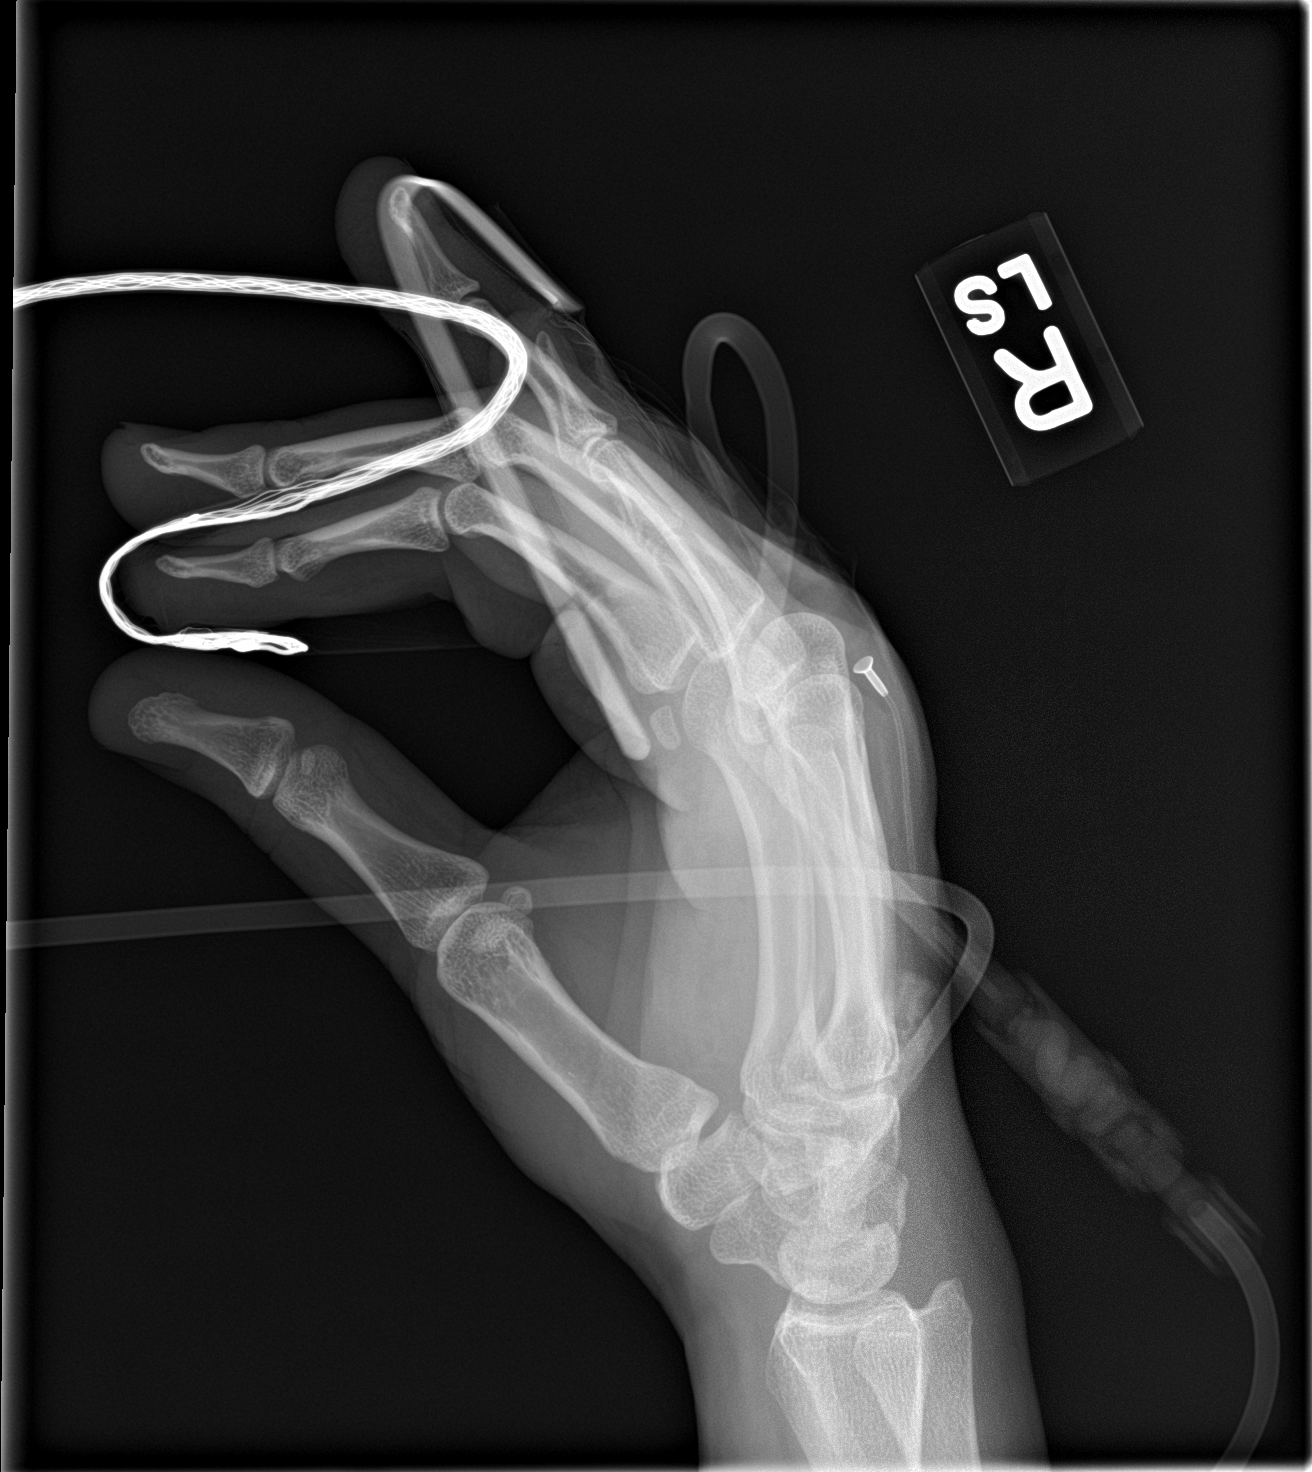

[2 of 2 positions shown; findings below may reference images not displayed]

FINDINGS: Improved alignment of the fifth digit proximal interphalangeal joint
dislocation. Alignment appears anatomic on the PA view, obscured on
the lateral view. Fracture involving the radial aspect of the middle
phalanx is in improved near anatomic alignment. No new abnormalities
seen.
IMPRESSION: Improved alignment of fifth digit dislocation and middle phalanx
fracture.
